# Patient Record
Sex: Male | Born: 1955 | Race: White | Hispanic: No
Health system: Southern US, Community
[De-identification: ages and names within clinical notes are randomized; demographics above are authoritative.]

---

## 2000-04-30 ENCOUNTER — Encounter: Admission: RE | Admit: 2000-04-30 | Discharge: 2000-07-29 | Payer: Self-pay | Admitting: Anesthesiology

## 2000-08-28 ENCOUNTER — Encounter: Admission: RE | Admit: 2000-08-28 | Discharge: 2000-11-26 | Payer: Self-pay | Admitting: Anesthesiology

## 2000-10-17 ENCOUNTER — Encounter: Payer: Self-pay | Admitting: Neurosurgery

## 2000-10-17 ENCOUNTER — Ambulatory Visit (HOSPITAL_COMMUNITY): Admission: RE | Admit: 2000-10-17 | Discharge: 2000-10-17 | Payer: Self-pay | Admitting: Neurosurgery

## 2002-08-05 ENCOUNTER — Emergency Department (HOSPITAL_COMMUNITY): Admission: EM | Admit: 2002-08-05 | Discharge: 2002-08-05 | Payer: Self-pay | Admitting: Emergency Medicine

## 2004-10-23 ENCOUNTER — Ambulatory Visit: Payer: Self-pay | Admitting: Family Medicine

## 2004-12-27 ENCOUNTER — Ambulatory Visit: Payer: Self-pay | Admitting: Family Medicine

## 2005-03-22 ENCOUNTER — Ambulatory Visit: Payer: Self-pay | Admitting: Family Medicine

## 2005-04-02 ENCOUNTER — Ambulatory Visit: Payer: Self-pay | Admitting: Family Medicine

## 2005-05-07 ENCOUNTER — Ambulatory Visit: Payer: Self-pay | Admitting: Family Medicine

## 2005-05-27 ENCOUNTER — Encounter: Admission: RE | Admit: 2005-05-27 | Discharge: 2005-05-27 | Payer: Self-pay | Admitting: Gastroenterology

## 2005-05-30 ENCOUNTER — Ambulatory Visit: Payer: Self-pay | Admitting: Family Medicine

## 2005-09-02 ENCOUNTER — Ambulatory Visit: Payer: Self-pay | Admitting: Family Medicine

## 2005-10-24 ENCOUNTER — Ambulatory Visit: Payer: Self-pay | Admitting: Family Medicine

## 2006-01-29 ENCOUNTER — Ambulatory Visit: Payer: Self-pay | Admitting: Family Medicine

## 2006-04-30 ENCOUNTER — Ambulatory Visit: Payer: Self-pay | Admitting: Family Medicine

## 2006-07-21 IMAGING — RF DG ESOPHAGUS
12 of 13 series · 12 of 13 positions shown · non-contrast
Comparison: none

CLINICAL DATA: Dysphagia.

ESOPHAGRAM:
TECHNIQUE: Routine esophagram was performed under fluoroscopy.

[Series 1: run · 1 of 1 slices shown (1 of 12)]
[im 1/1]
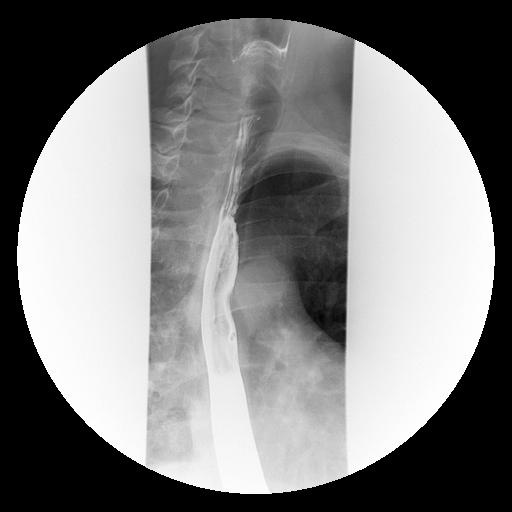

[Series 2: run · 1 of 1 slices shown (2 of 12)]
[im 1/1]
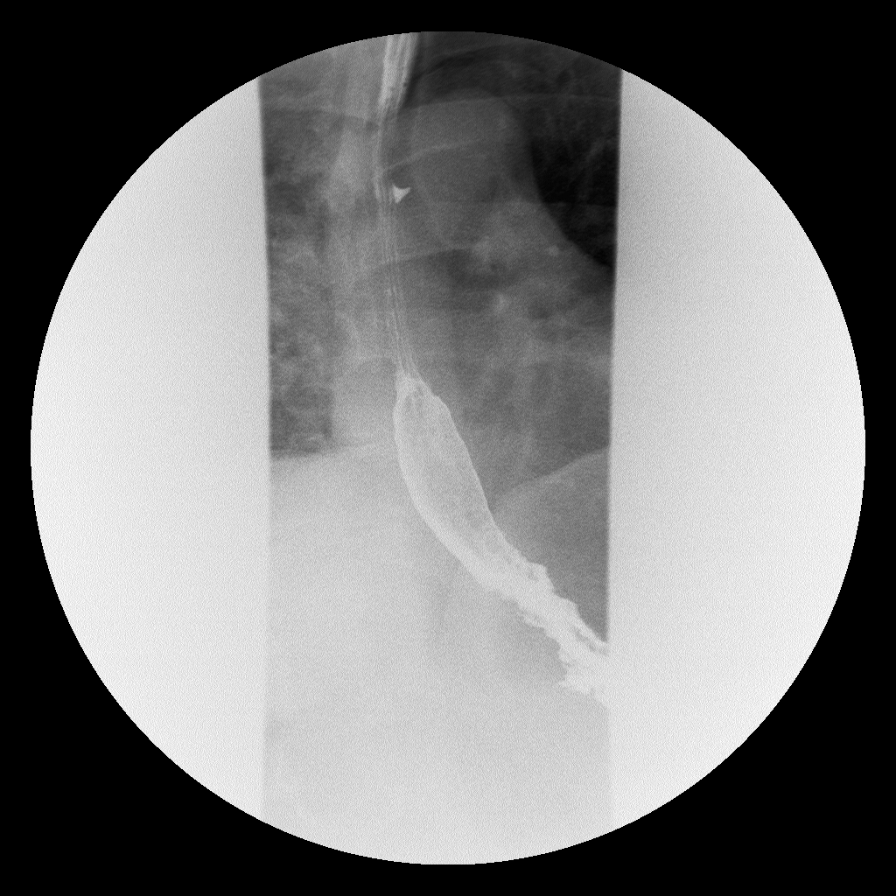

[Series 3: run · 1 of 1 slices shown (3 of 12)]
[im 1/1]
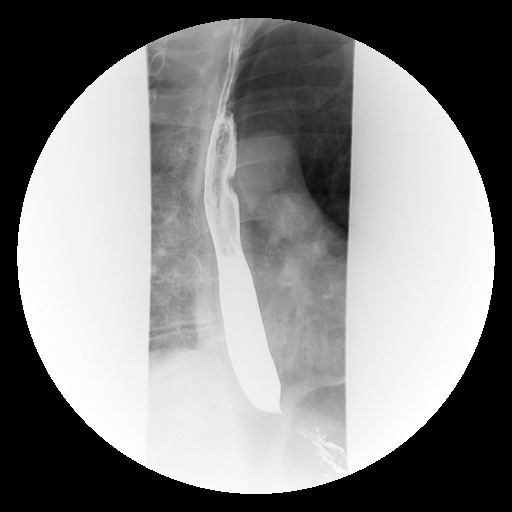

[Series 4: run · 1 of 1 slices shown (4 of 12)]
[im 1/1]
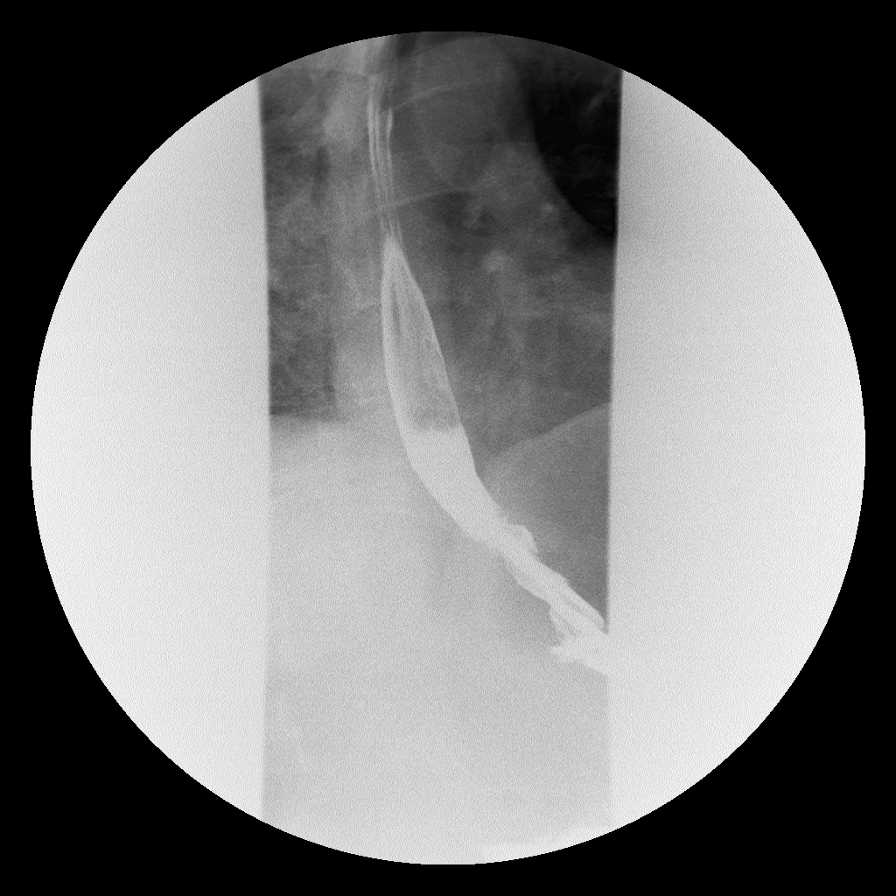

[Series 5: run · 1 of 1 slices shown (5 of 12)]
[im 1/1]
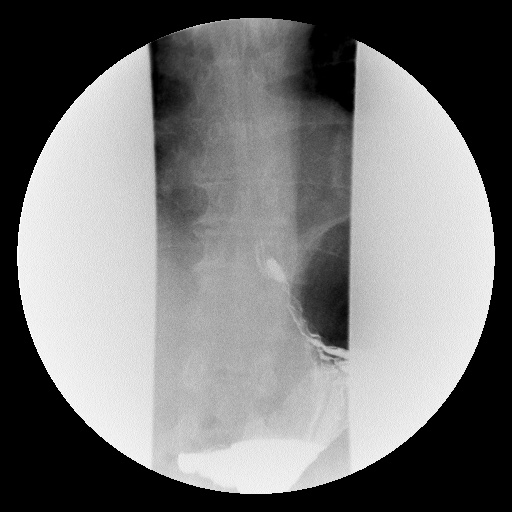

[Series 6: run · 1 of 1 slices shown (6 of 12)]
[im 1/1]
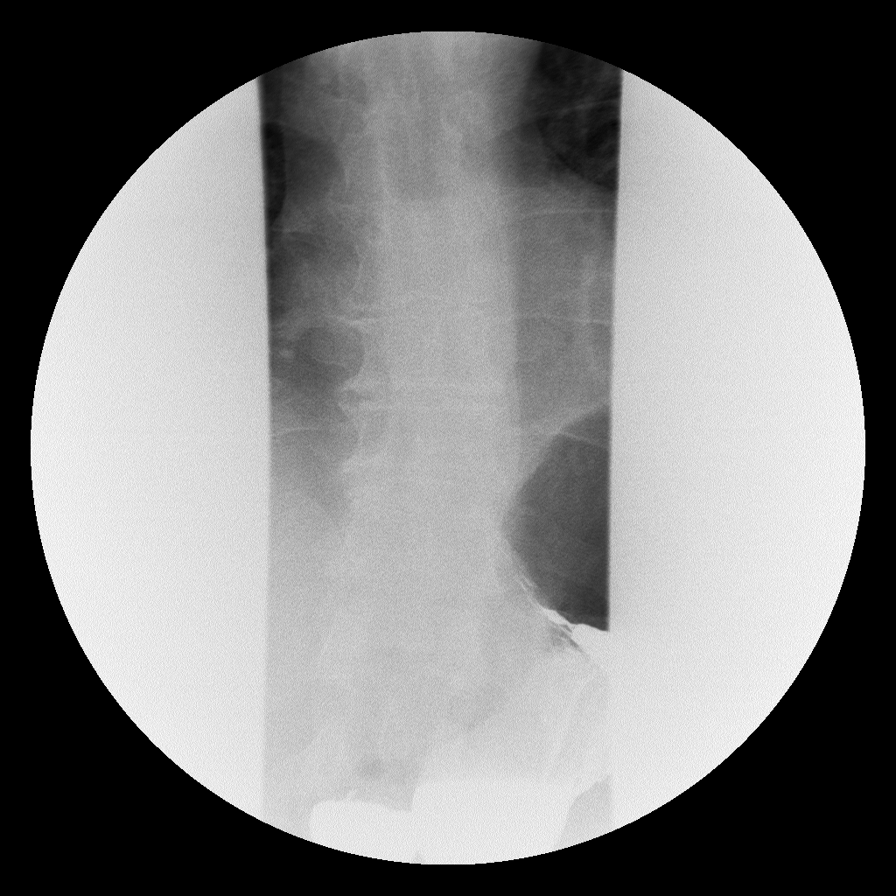

[Series 9: run · 1 of 1 slices shown (7 of 12)]
[im 1/1]
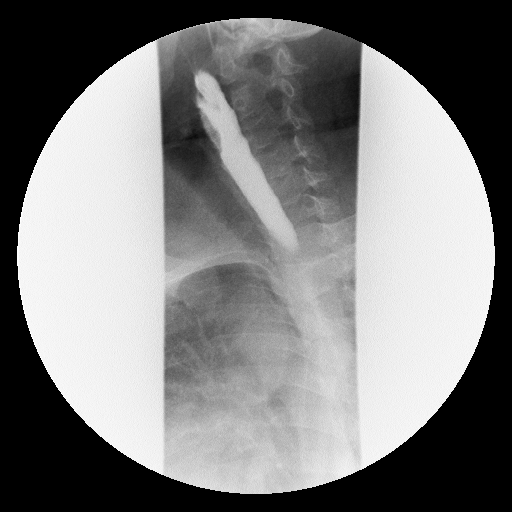

[Series 10: run · 1 of 1 slices shown (8 of 12)]
[im 1/1]
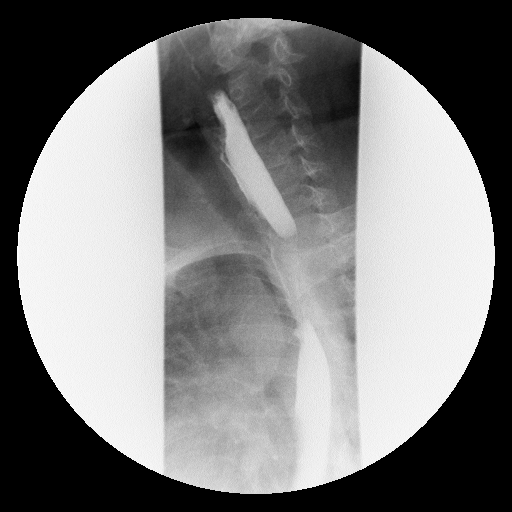

[Series 11: run · 1 of 1 slices shown (9 of 12)]
[im 1/1]
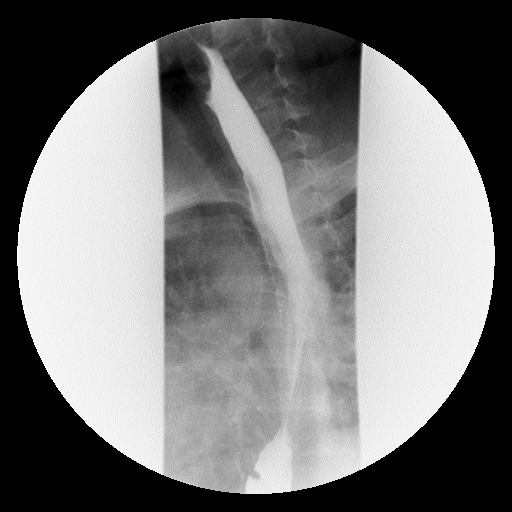

[Series 12: run · 1 of 1 slices shown (10 of 12)]
[im 1/1]
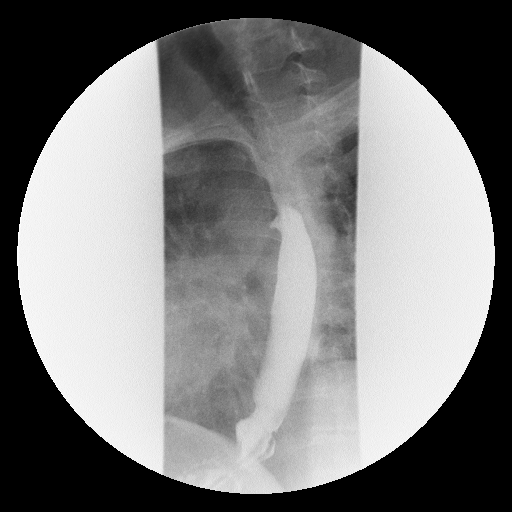

[Series 13: run · 1 of 1 slices shown (11 of 12)]
[im 1/1]
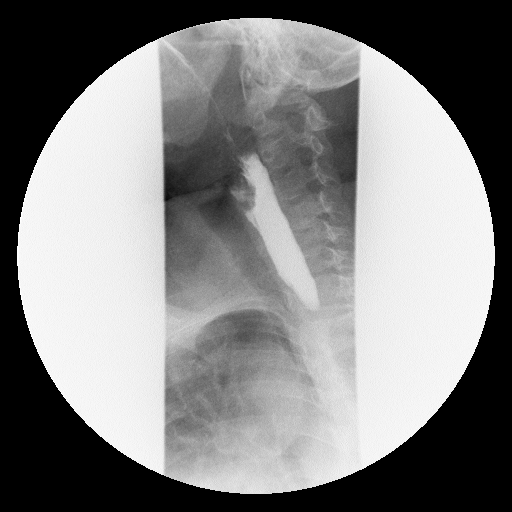

[Series 14: run · 1 of 1 slices shown (12 of 12)]
[im 1/1]
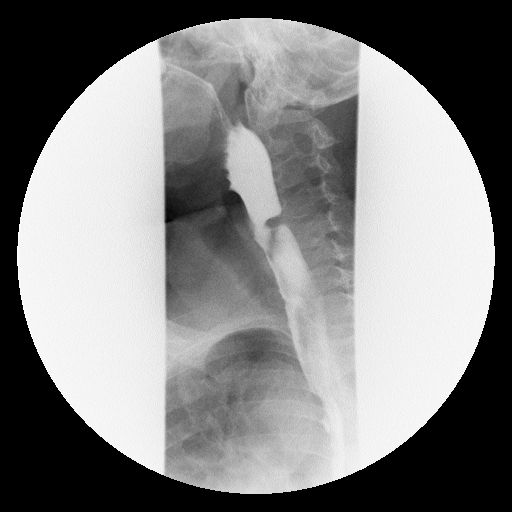

[12 of 13 positions shown; findings below may reference images not displayed]

FINDINGS: The patient swallowed barium without difficulty. Normal esophageal
peristalsis with no hypopharyngeal abnormalities seen. Enlarged cricopharyngeus
muscle without significant luminal narrowing. Small sliding hiatal hernia. No
gastroesophageal reflux, strictures, masses or ulcerations seen. A small
diverticulum was intermittently demonstrated arising from the anterior aspect of
the proximal to mid esophagus. The patient swallowed a 12.5 mm in diameter
barium tablet without difficulty. This passed normally through the esophagus and
into the stomach.
IMPRESSION: 1. Small sliding hiatal hernia with associated hypertrophy of the
cricopharyngeus muscle.

2. Small diverticulum arising from the anterior aspect of the proximal to mid
esophagus.

## 2006-08-06 ENCOUNTER — Ambulatory Visit: Payer: Self-pay | Admitting: Family Medicine

## 2006-11-13 ENCOUNTER — Ambulatory Visit: Payer: Self-pay | Admitting: Family Medicine

## 2007-05-06 ENCOUNTER — Ambulatory Visit: Payer: Self-pay | Admitting: Family Medicine

## 2009-04-07 ENCOUNTER — Encounter: Admission: RE | Admit: 2009-04-07 | Discharge: 2009-04-07 | Payer: Self-pay | Admitting: Gastroenterology

## 2010-06-01 IMAGING — US US ABDOMEN COMPLETE
1 series · 14 of 25 positions shown · non-contrast
Comparison: None

CLINICAL DATA: Elevated LFTs.

ABDOMINAL ULTRASOUND COMPLETE

[Series 1: us abdomen complete · 0.31mm/px · 14 of 87 slices shown]
[im 1/87]
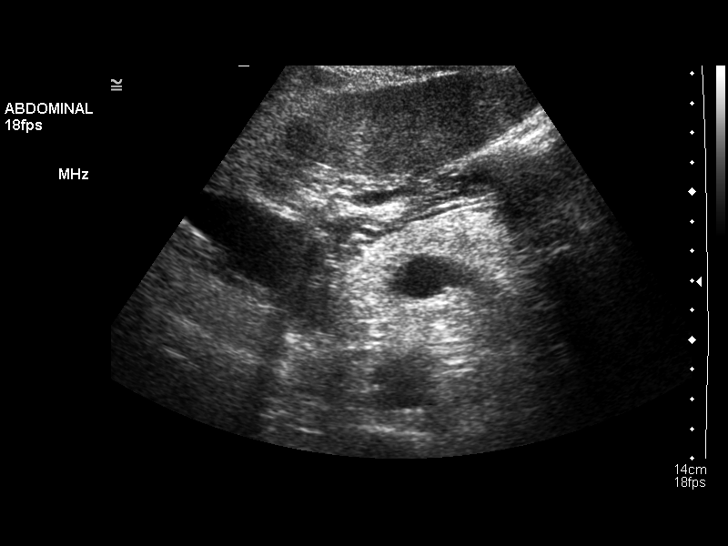
[im 8/87]
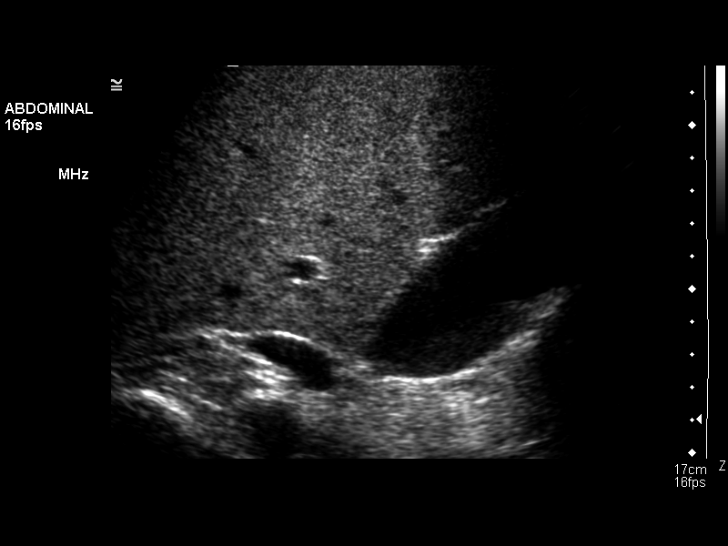
[im 15/87]
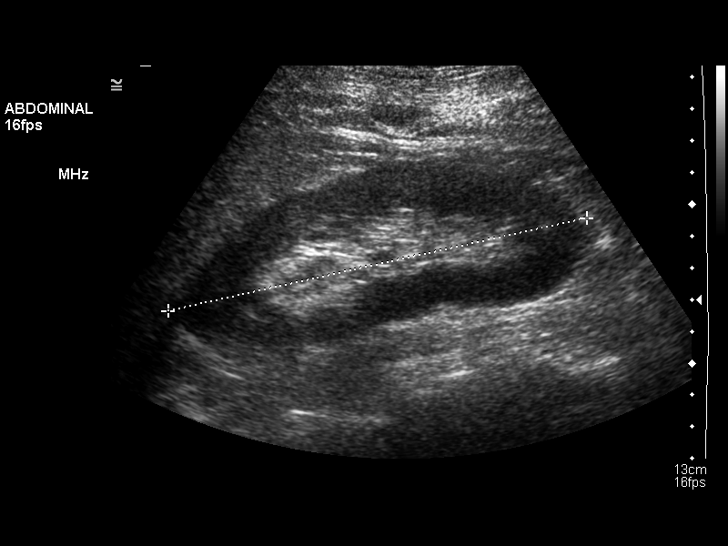
[im 22/87]
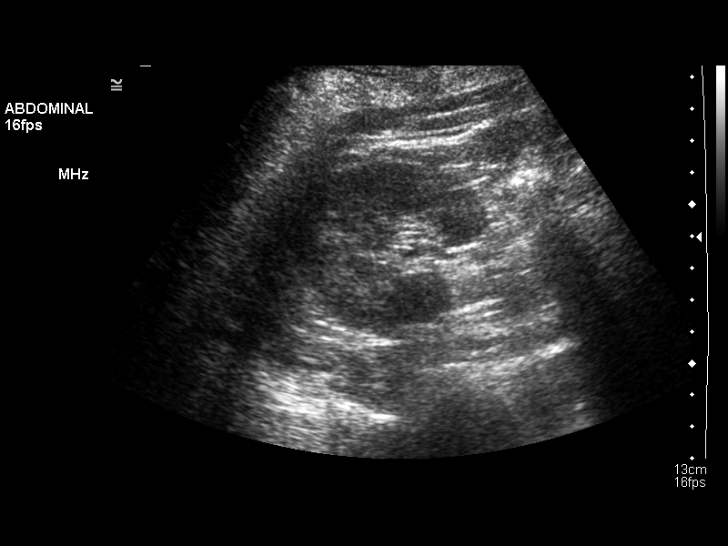
[im 29/87]
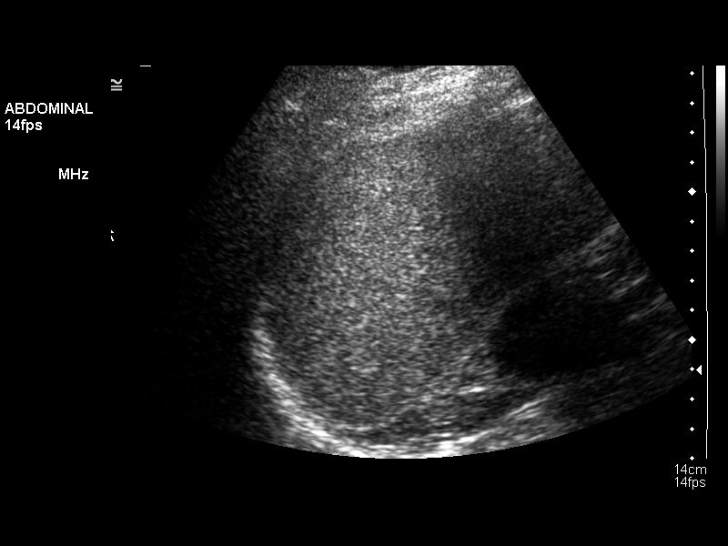
[im 33/87]
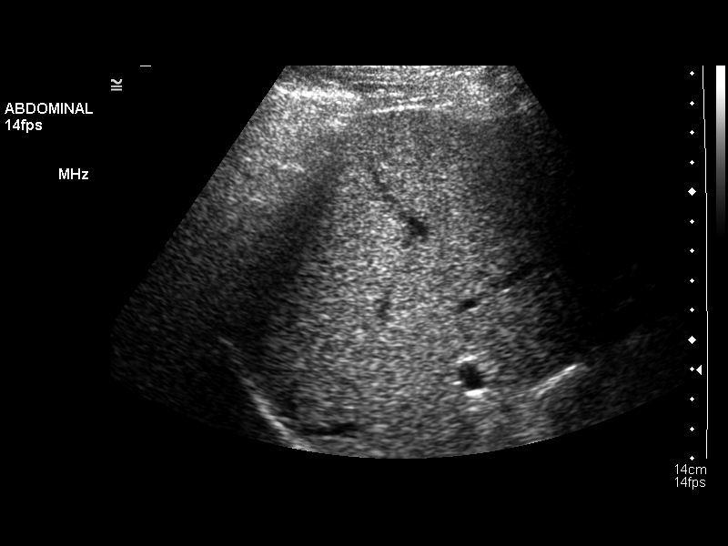
[im 40/87]
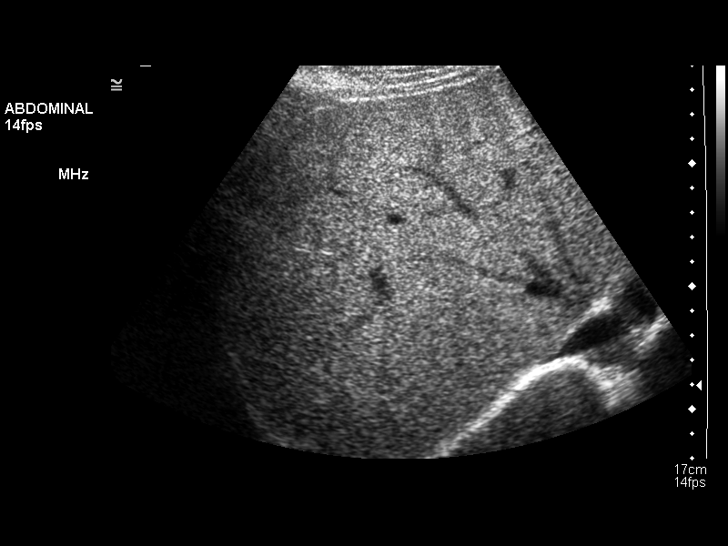
[im 47/87]
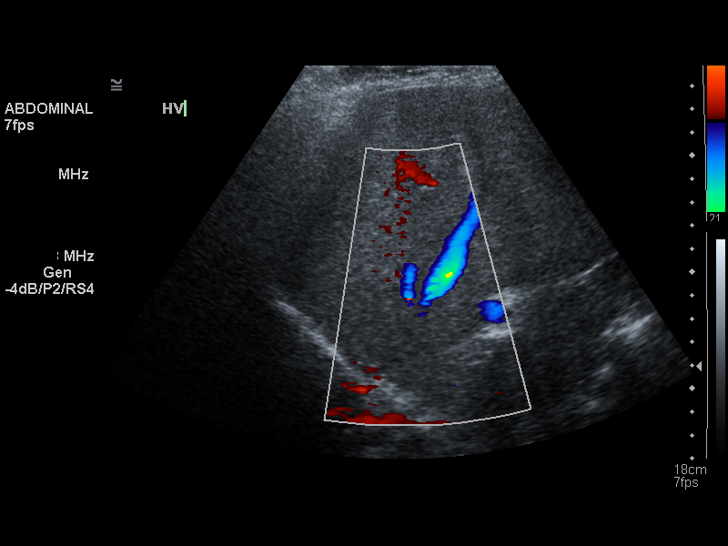
[im 54/87]
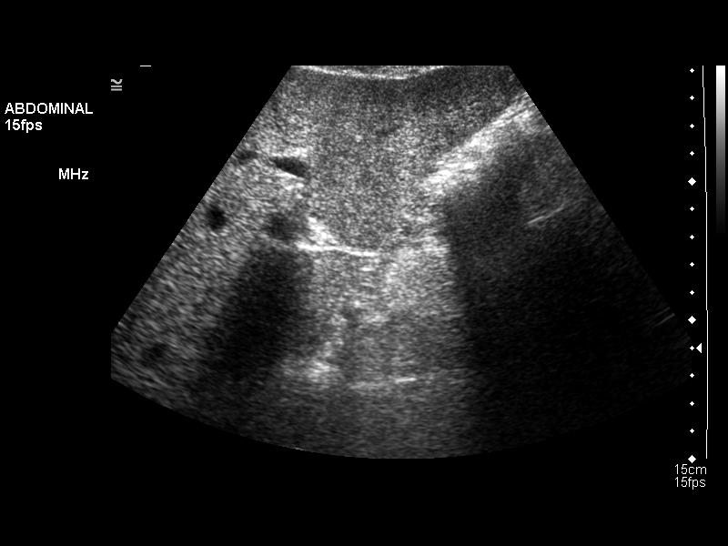
[im 58/87]
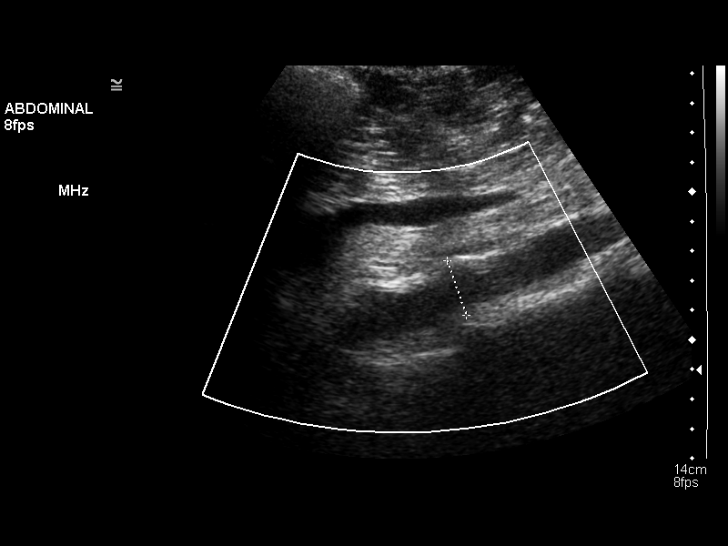
[im 65/87]
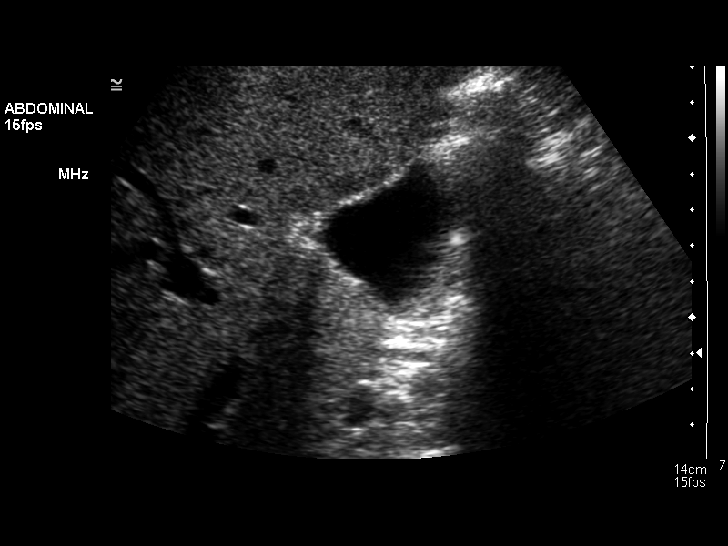
[im 72/87]
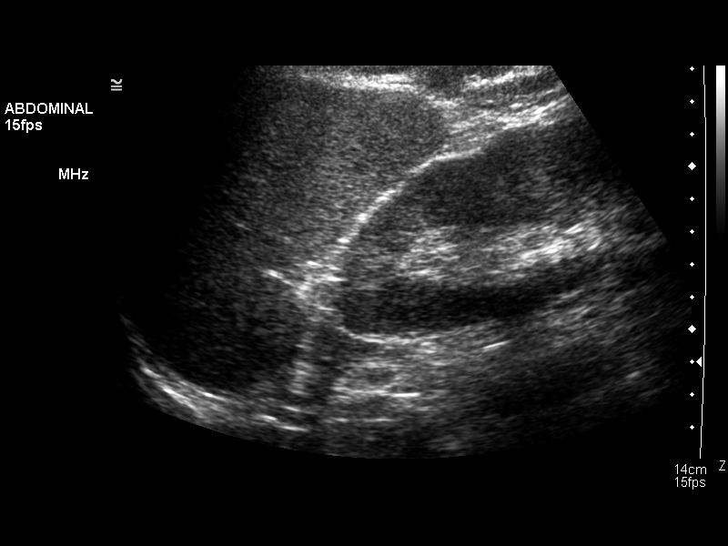
[im 79/87]
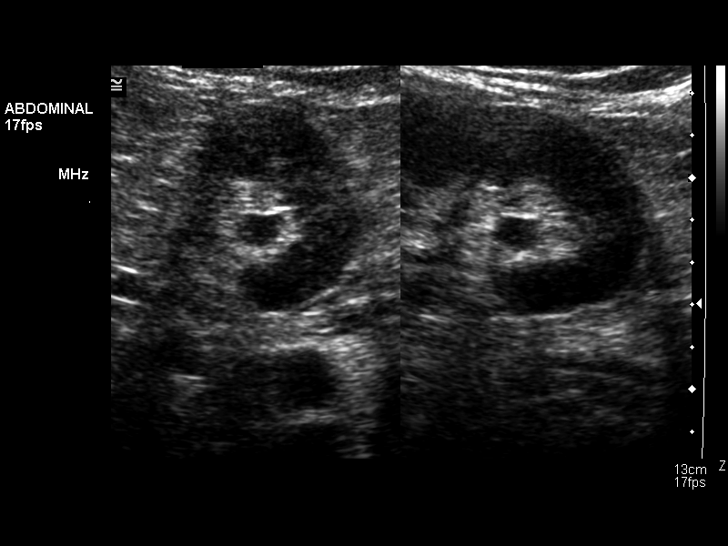
[im 87/87]
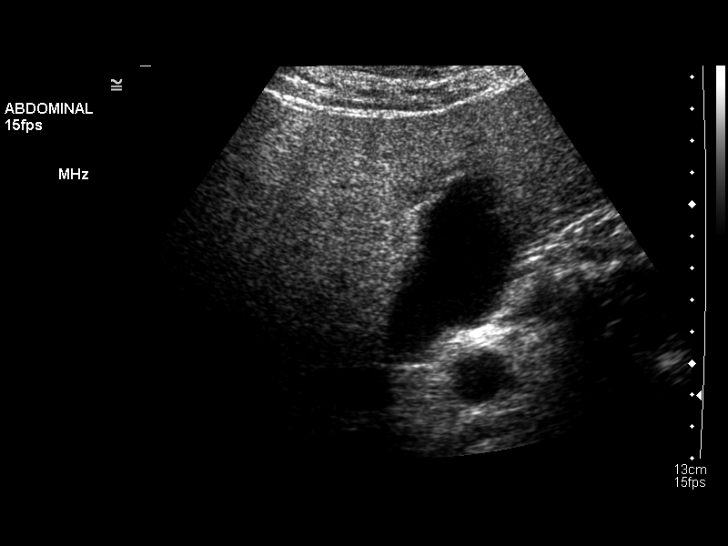

[14 of 25 positions shown; findings below may reference images not displayed]

FINDINGS: Gallbladder:  No gallstones, gallbladder wall thickening, or
pericholecystic fluid. 

Common bile duct: Within normal limits in caliber. The CBD measures
3 mm in greatest diameter.

Liver:  No focal lesion identified.  Mild increased echogenicity of
the liver is compatible with mild fatty infiltration.

Inferior vena cava:  Intrahepatic portion appears normal.

Pancreas:  Although entire pancreas not visualized due to bowel
gas, no abnormality identified.

Spleen:  Within normal limits in size and echogenicity.

Right kidney:  Normal in size and parenchymal echogenicity.  No
evidence of mass or hydronephrosis.

Left kidney:  Normal in size and parenchymal echogenicity.   No
evidence of solid mass or hydronephrosis.  A 1 cm lower pole cyst
is noted.

Abdominal aorta:  No aneurysm identified.
IMPRESSION: Mild fatty infiltration of the liver.

No other significant abnormalities identified.

## 2011-04-26 NOTE — Procedures (Signed)
[facility]  Patient:    Bradley Preston, Bradley Preston                      MRN: [id number] Proc. Date: 08/29/00 Adm. Date:  [id number] Attending:  Thyra Breed CC:         Donzetta Sprung. Roney Jaffe., M.D.   Procedure Report  PROCEDURE:  Lumbar epidural steroid injection.  DIAGNOSIS:  Lumbar radiculopathy with underlying degenerative disk disease.  INTERVAL HISTORY:  The patient was doing quite well up until recently, and has had recurrence of his pain.  He is rating his pain at 4 out of 10.  He comes for his third injection.  His last injections were done back in June.  PHYSICAL EXAMINATION:  VITAL SIGNS:  Blood pressure 145/83, heart rate 88, respiratory rate 16, O2 saturation 95%.  Pain level is 4 out of 10.  NEUROLOGIC:  Grossly unchanged.  BACK:  His back shows good healing from his previous injection site.  DESCRIPTION OF PROCEDURE:  After informed consent was obtained, the patient was placed in the sitting position and monitored.  His back was prepped with Betadine x 3.  A skin wheal was raised at the L4-5 interspace with 1% lidocaine.  A 20-gauge Tuohy needle was introduced to the lumbar epidural space with loss of resistance to preservative-free normal saline.  There was no CSF nor blood.  Eight milligrams of Medrol and 8 ml of preservative-free normal saline was gently injected.  The needle was flushed with preservative-free normal saline and removed intact.  POSTPROCEDURE CONDITION:  Stable.  DISCHARGE INSTRUCTIONS: 1. Resume previous diet. 2. Limitation of activities per instruction sheet. 3. Continue on current medications. 4. The patient plans to follow up with Dr. Donalee Citrin. DD:  08/29/00 TD:  09/01/00 Job: [id number]

## 2011-04-26 NOTE — Procedures (Signed)
[facility]  Patient:    Bradley Preston, Bradley Preston                      MRN: [id number] Proc. Date: 04/30/00 Adm. Date:  [id number] Attending:  Thyra Breed CC:         Jessee Avers, M.D.                           Procedure Report  PROCEDURE:  Lumbar epidural steroid injection.  DIAGNOSIS:  Lumbar radiculopathy with underlying degenerative disk disease.  HISTORY OF PRESENT ILLNESS:  Bradley Preston is a 55 year old gentleman who is sent to [place] by Dr. Donalee Citrin for a series of lumbar epidural steroid injections. The patient states that he was in his usual state of health up until January 15, 2000 when he injured his back while at work. He was seen by Dr. Louanna Raw and treated with Vioxx and Vicodin. He was also given chiropractic treatments. He improved slightly to moderately with this. Because of failure to resolve his symptoms, he was seen by Dr. Tanda Rockers. Dr. Lesia Hausen obtained an MRI in [place] on March 12, 2000 which demonstrated a disk bulge and tear at L5-S1. He was sent for physical therapy for an initial 3 weeks and then sent to Dr. Wynetta Emery who evaluated him recently and elected to send him for a series of lumbar epidural steroid injections. He is currently taking Vioxx and Vicodin for his pain and he notes that the Vicodin does help with his discomfort. He complains of a burning achy pain with associated numbness and tingling out into the right lower extremity with pain radiating out into his right lateral calf. He denied weakness or bowel or bladder incontinence but does have numbness and tingling. It is made worse by sitting for long periods of time or lifting and improved by laying flat and taking Vicodin.  CURRENT MEDICATIONS:  Glipizide, Lotensin, Vioxx and Vicodin.  ALLERGIES:  No known drug allergies.  FAMILY HISTORY:  Positive for coronary artery disease, diabetes and hypertension.  PAST SURGICAL HISTORY:  Significant for diverticulitis, status post  partial colectomy in 1984 with reanastomosis and a history of renal calculi.  SOCIAL HISTORY:  The patient is a nonsmoker, nondrinker. He works in [employer]. He has been out of work since March 31, 2000.  ACTIVE MEDICAL PROBLEMS:  See past surgical history.  REVIEW OF SYSTEMS:  GENERAL:  Negative. HEENT:  Head negative. Nose, mouth, throat negative. Eyes negative. Ears negative. LUNGS:  Negative. CARDIOVASCULAR:  negative. GI:  See active medical problems.  GU:  History of renal calculi. MUSCULOSKELETAL:  He has some arthralgias but no swelling of his joints. NEUROLOGIC:  See HPI for pertinent positives.  CUTANEOUS: Negative. HEMATOLOGIC:  Negative.  ENDOCRINE:  Positive for diabetes x 5 years with no associated numbness of his feet or renal problems. He is on Lotensin for his diabetes. PSYCHIATRIC: Negative.  ALLERGY/IMMUNOLOGIC:  Negative.  PHYSICAL EXAMINATION:  VITAL SIGNS:  Blood pressure 136/84, heart rate 84, respiratory rate 16, O2 saturations 96% and temperature is 97.2 with a pain level of 8/10.  GENERAL:  This is a pleasant male in no acute distress.  HEENT:  Head was normocephalic, atraumatic. Eyes extraocular movements intact with conjunctiva and sclera clear. Nose patent nares without discharge. Oropharynx was free of lesions.  NECK:  Supple without lymphadenopathy. Carotids were 2+ and symmetric without bruits.  LUNGS:  Clear.  HEART:  Regular  rate and rhythm.  ABDOMEN:  Bowel sounds present.  GENITALIA/RECTAL:  Not performed.  BACK:  Revealed positive straight leg raise sign on the right side at 70 degrees.  EXTREMITIES:  No cyanosis, clubbing or edema with radial pulses and dorsalis pedis pulse 2+ and symmetric.  NEUROLOGIC:  The patient was oriented x 4. Cranial nerves II-XII are grossly intact. Deep tendon reflexes were 2+ and symmetric in the upper and lower extremity with down going toes. Sensory was significant for attenuated pinprick over  the lateral aspect of the right calf. He exhibited symmetric bulk and tone of his muscles with some mild plantar flexion weakness of the right foot relative to the left.  IMPRESSION: 1. Lumbar radiculopathy most likely in the S1 distribution with underlying    L5-S1 degenerative disk disease with annular tear. 2. Diabetes mellitus. 3. History of renal calculi. 4. Diverticular disease.  I discussed the potential risks, benefits and limitations of a lumbar epidural steroid injection in detail with the patient and his wife. Their questions were answered.  DESCRIPTION OF PROCEDURE:  After informed consent was obtained, the patient was placed in the sitting position and monitored. The patients back was prepped with Betadine x 3. A skin wheal was raised at the L4-5 interspace with 1 percent lidocaine. A 20 gauge Tuohy needle was introduced to the lumbar epidural space to loss of resistance to preservative free normal saline. There was no cerebrospinal fluid nor blood. 40 mg of Medrol and 10 ml of preservative free normal saline was gently injected. The needle was flushed with preservative free normal saline and removed intact.  CONDITION POST PROCEDURE:  Stable.  DISCHARGE INSTRUCTIONS:  Resume previous diet. Limitations in activities per instruction sheet. Continue on current medications. Follow blood sugars closely. Follow-up with me in 1 week for a second injection. DD:  04/30/00 TD:  05/04/00 Job: [id number]

## 2011-04-26 NOTE — Procedures (Signed)
[facility]  Patient:    Bradley Preston, Bradley Preston                      MRN: [id number] Proc. Date: 05/19/00 Adm. Date:  [id number] Attending:  Thyra Breed CC:         Madolyn Frieze. Jens Som, M.D. [facility]                           Procedure Report  PROCEDURE PERFORMED:  Lumbar epidural steroid injection.  ANESTHESIOLOGIST:  Thyra Breed, M.D.  DIAGNOSIS:  Lumbar radiculopathy with underlying degenerative disk disease.  INTERVAL HISTORY:  The patient continues to have leg discomfort but his back is markedly improved after the first injection.  His blood sugars did get up in the 160s.  PHYSICAL EXAMINATION:  Blood pressure 125/79, heart rate 67, respiratory rate 12, oxygen saturation 99%, pain level is 4/10.  Temperature is 98.6.  He has good healing from his previous injection site.  DESCRIPTION OF PROCEDURE:  After informed consent was obtained, the patient was placed in sitting position and monitored.  His back was prepped with Betadine x 3.  A skin wheal was raised at the L3-4 interspace with 1% lidocaine.  A 20 gauge Tuohy needle was introduced into the lumbar epidural space to a loss of resistance to preservative free normal saline.  There was no CSF or blood.  40 mg of Medrol in 10 ml of preservative free normal saline were gently injected.  The needle was flushed with preservative free normal saline and removed intact.  Postprocedure condition was stable.  DISCHARGE INSTRUCTIONS: 1. Resume previous diet. 2. Limitations in activities per instruction sheet. 3. Continue on current medications. 4. Follow up with me in one week if his pain is persisting.  Otherwise, he    is to hold off.  I advised him that he seems to have responded very nicely    to the first injection.  I am concerned that he does continue to have a    radicular component.  He is not scheduled to see Dr. Wynetta Emery back until    September. DD:  05/19/00 TD:  05/21/00 Job: [id number]

## 2017-09-16 ENCOUNTER — Encounter (HOSPITAL_COMMUNITY)
Admission: RE | Disposition: A | Discharge status: Home / Self Care | Payer: Self-pay | Source: Ambulatory Visit | Attending: Ophthalmology

## 2017-09-16 ENCOUNTER — Ambulatory Visit (HOSPITAL_COMMUNITY)
Admission: RE | Admit: 2017-09-16 | Discharge: 2017-09-16 | Disposition: A | Discharge status: Home / Self Care | Payer: BLUE CROSS/BLUE SHIELD | Source: Ambulatory Visit | Attending: Ophthalmology | Admitting: Ophthalmology

## 2017-09-16 DIAGNOSIS — H26492 Other secondary cataract, left eye: Secondary | ICD-10-CM | POA: Diagnosis present

## 2017-09-16 DIAGNOSIS — I10 Essential (primary) hypertension: Secondary | ICD-10-CM | POA: Insufficient documentation

## 2017-09-16 DIAGNOSIS — Z7984 Long term (current) use of oral hypoglycemic drugs: Secondary | ICD-10-CM | POA: Diagnosis not present

## 2017-09-16 DIAGNOSIS — E119 Type 2 diabetes mellitus without complications: Secondary | ICD-10-CM | POA: Diagnosis not present

## 2017-09-16 DIAGNOSIS — Z7982 Long term (current) use of aspirin: Secondary | ICD-10-CM | POA: Insufficient documentation

## 2017-09-16 DIAGNOSIS — Z79899 Other long term (current) drug therapy: Secondary | ICD-10-CM | POA: Diagnosis not present

## 2017-09-16 HISTORY — PX: YAG LASER APPLICATION: SHX6189

## 2017-09-16 SURGERY — TREATMENT, USING YAG LASER
Anesthesia: LOCAL | Laterality: Left

## 2017-09-16 MED ORDER — CYCLOPENTOLATE-PHENYLEPHRINE 0.2-1 % OP SOLN
1.0000 [drp] | OPHTHALMIC | Status: AC
  Administered 2017-09-16 (×3): 1 [drp] via OPHTHALMIC

## 2017-09-16 MED ORDER — CYCLOPENTOLATE-PHENYLEPHRINE 0.2-1 % OP SOLN
OPHTHALMIC | Status: AC
  Filled 2017-09-16: qty 2

## 2017-09-16 NOTE — Discharge Instructions (Signed)
Bradley Preston  09/16/2017     Instructions    Activity: No Restrictions.   Diet: Resume Diet you were on at home.   Pain Medication: Tylenol if Needed.   CONTACT YOUR DOCTOR IF YOU HAVE PAIN, REDNESS IN YOUR EYE, OR DECREASED VISION.   Follow-up:today with Jethro Bolus, MD.   Dr. Nile Riggs: [phone]  Dr. Lita Mains: [phone]  Dr. Alto Denver: [phone]   If you find that you cannot contact your physician, but feel that your signs and   Symptoms warrant a physician's attention, call the Emergency Room at   [phone] [phone].   Othern/a  . Follow up today with Dr Nile Riggs at his office at 1:00PM

## 2017-09-16 NOTE — H&P (Signed)
The patient was re examined and there is no change in the patients condition since the original H and P. 

## 2017-09-16 NOTE — Op Note (Signed)
Bradley Preston T. Nile Riggs, MD  Procedure: Yag Capsulotomy  Yag Laser Self Test Completedyes. Procedure: Posterior Capsulotomy, Eye Protection Worn by Staff yes. Laser In Use Sign on Door yes.  Laser: Nd:YAG Spot Size: Fixed Burst Mode: III Power Setting: 3.4 mJ/burst Number of shots: 26 Total energy delivered: 81.90 mJ   The patient tolerated the procedure without difficulty. No complications were encountered.   The patient was discharged home with the instructions to continue all her current glaucoma medications, if any.   Patient instructed to go to office at 0100 for intraocular pressure check.  Patient verbalizes understanding of discharge instructions Yes.  .    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

## 2017-09-18 ENCOUNTER — Encounter (HOSPITAL_COMMUNITY): Payer: Self-pay | Admitting: Ophthalmology
